# Patient Record
Sex: Male | Born: 1960 | Race: Black or African American | Hispanic: No | Marital: Single | Smoking: Never smoker
Health system: Southern US, Community
[De-identification: ages and names within clinical notes are randomized; demographics above are authoritative.]

---

## 2002-03-15 ENCOUNTER — Emergency Department (HOSPITAL_COMMUNITY): Admission: EM | Admit: 2002-03-15 | Discharge: 2002-03-15 | Payer: Self-pay

## 2004-11-12 ENCOUNTER — Emergency Department (HOSPITAL_COMMUNITY): Admission: EM | Admit: 2004-11-12 | Discharge: 2004-11-12 | Payer: Self-pay | Admitting: Emergency Medicine

## 2005-11-17 ENCOUNTER — Emergency Department (HOSPITAL_COMMUNITY): Admission: EM | Admit: 2005-11-17 | Discharge: 2005-11-17 | Payer: Self-pay | Admitting: Emergency Medicine

## 2006-11-07 ENCOUNTER — Emergency Department (HOSPITAL_COMMUNITY): Admission: EM | Admit: 2006-11-07 | Discharge: 2006-11-08 | Payer: Self-pay | Admitting: Emergency Medicine

## 2006-11-15 ENCOUNTER — Emergency Department (HOSPITAL_COMMUNITY): Admission: EM | Admit: 2006-11-15 | Discharge: 2006-11-15 | Payer: Self-pay | Admitting: Emergency Medicine

## 2007-06-21 ENCOUNTER — Emergency Department (HOSPITAL_COMMUNITY): Admission: EM | Admit: 2007-06-21 | Discharge: 2007-06-22 | Payer: Self-pay | Admitting: Emergency Medicine

## 2008-08-01 ENCOUNTER — Emergency Department (HOSPITAL_COMMUNITY): Admission: EM | Admit: 2008-08-01 | Discharge: 2008-08-01 | Payer: Self-pay | Admitting: Emergency Medicine

## 2009-08-29 ENCOUNTER — Emergency Department (HOSPITAL_COMMUNITY): Admission: EM | Admit: 2009-08-29 | Discharge: 2009-08-29 | Payer: Self-pay | Admitting: Emergency Medicine

## 2010-10-07 LAB — DIFFERENTIAL
Basophils Absolute: 0 10*3/uL (ref 0.0–0.1)
Basophils Relative: 1 % (ref 0–1)
Eosinophils Absolute: 0.1 10*3/uL (ref 0.0–0.7)
Eosinophils Relative: 1 % (ref 0–5)
Lymphs Abs: 2.1 10*3/uL (ref 0.7–4.0)
Monocytes Absolute: 0.5 10*3/uL (ref 0.1–1.0)
Monocytes Relative: 8 % (ref 3–12)
Neutro Abs: 3.3 10*3/uL (ref 1.7–7.7)
Neutrophils Relative %: 55 % (ref 43–77)

## 2010-10-07 LAB — POCT I-STAT, CHEM 8
Chloride: 105 mEq/L (ref 96–112)
TCO2: 28 mmol/L (ref 0–100)

## 2010-10-07 LAB — CBC
Hemoglobin: 12.7 g/dL — ABNORMAL LOW (ref 13.0–17.0)
MCV: 98.6 fL (ref 78.0–100.0)
Platelets: 253 10*3/uL (ref 150–400)
RDW: 12.7 % (ref 11.5–15.5)

## 2011-04-26 LAB — CULTURE, ROUTINE-ABSCESS

## 2019-06-28 ENCOUNTER — Encounter (HOSPITAL_COMMUNITY): Payer: Self-pay | Admitting: Emergency Medicine

## 2019-06-28 ENCOUNTER — Other Ambulatory Visit: Payer: Self-pay

## 2019-06-28 ENCOUNTER — Ambulatory Visit (HOSPITAL_COMMUNITY)
Admission: EM | Admit: 2019-06-28 | Discharge: 2019-06-28 | Disposition: A | Discharge status: Home / Self Care | Payer: Self-pay | Attending: Family Medicine | Admitting: Family Medicine

## 2019-06-28 DIAGNOSIS — S39012A Strain of muscle, fascia and tendon of lower back, initial encounter: Secondary | ICD-10-CM

## 2019-06-28 MED ORDER — IBUPROFEN 800 MG PO TABS: 800.0000 mg | ORAL_TABLET | Freq: Three times a day (TID) | ORAL | 0 refills | Status: DC

## 2019-06-28 MED ORDER — CYCLOBENZAPRINE HCL 10 MG PO TABS: ORAL_TABLET | ORAL | 0 refills | Status: DC
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
## 2019-06-28 NOTE — Discharge Instructions (Signed)

## 2020-05-13 ENCOUNTER — Ambulatory Visit: Payer: Self-pay | Attending: Critical Care Medicine

## 2020-05-13 ENCOUNTER — Ambulatory Visit: Payer: Self-pay | Admitting: *Deleted

## 2020-05-13 DIAGNOSIS — Z23 Encounter for immunization: Secondary | ICD-10-CM

## 2020-05-13 NOTE — Progress Notes (Signed)
Patient reports no concern with previous injection. Patient observed with no concern.

## 2021-07-21 ENCOUNTER — Ambulatory Visit (INDEPENDENT_AMBULATORY_CARE_PROVIDER_SITE_OTHER): Payer: Self-pay

## 2021-07-21 ENCOUNTER — Encounter (HOSPITAL_COMMUNITY): Payer: Self-pay | Admitting: Emergency Medicine

## 2021-07-21 ENCOUNTER — Ambulatory Visit (HOSPITAL_COMMUNITY)
Admission: EM | Admit: 2021-07-21 | Discharge: 2021-07-21 | Disposition: A | Discharge status: Home / Self Care | Payer: Self-pay | Attending: Urgent Care | Admitting: Urgent Care

## 2021-07-21 ENCOUNTER — Other Ambulatory Visit: Payer: Self-pay

## 2021-07-21 DIAGNOSIS — R319 Hematuria, unspecified: Secondary | ICD-10-CM

## 2021-07-21 DIAGNOSIS — R109 Unspecified abdominal pain: Secondary | ICD-10-CM

## 2021-07-21 LAB — POCT URINALYSIS DIPSTICK, ED / UC
Bilirubin Urine: NEGATIVE
Glucose, UA: NEGATIVE mg/dL
Ketones, ur: NEGATIVE mg/dL
Leukocytes,Ua: NEGATIVE
Nitrite: NEGATIVE
Protein, ur: NEGATIVE mg/dL
Specific Gravity, Urine: 1.025 (ref 1.005–1.030)
Urobilinogen, UA: 1 mg/dL (ref 0.0–1.0)
pH: 6 (ref 5.0–8.0)

## 2021-07-21 MED ORDER — TAMSULOSIN HCL 0.4 MG PO CAPS: 0.4000 mg | ORAL_CAPSULE | Freq: Every day | ORAL | 0 refills | Status: AC

## 2021-07-21 NOTE — Discharge Instructions (Signed)
Your flank pain is likely consistent with a kidney stone. It could also represent renal colic Sodas are a huge risk factor for both. MUST cut back on soda, start drinking water. 4-6 16oz bottles daily preferred. Will do a trial of tamsulosin - this will dilate the ureter to possibly pass a small stone. If your symptoms continue, you may need to establish care with a PCP to obtain further imaging such as an ultrasound or CT scan.
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
## 2021-07-21 NOTE — ED Triage Notes (Signed)
Pt having right flank pain for 3 days. Pain is constant. Denies falls or injuries or urinary or bowel problems.

## 2021-09-05 ENCOUNTER — Emergency Department (HOSPITAL_COMMUNITY)
Admission: EM | Admit: 2021-09-05 | Discharge: 2021-09-05 | Disposition: A | Discharge status: Home / Self Care | Payer: Self-pay | Attending: Emergency Medicine | Admitting: Emergency Medicine

## 2021-09-05 ENCOUNTER — Emergency Department (HOSPITAL_COMMUNITY): Payer: Self-pay

## 2021-09-05 DIAGNOSIS — T1490XA Injury, unspecified, initial encounter: Secondary | ICD-10-CM

## 2021-09-05 DIAGNOSIS — W3400XA Accidental discharge from unspecified firearms or gun, initial encounter: Secondary | ICD-10-CM | POA: Insufficient documentation

## 2021-09-05 DIAGNOSIS — S31101A Unspecified open wound of abdominal wall, left upper quadrant without penetration into peritoneal cavity, initial encounter: Secondary | ICD-10-CM | POA: Insufficient documentation

## 2021-09-05 DIAGNOSIS — Z23 Encounter for immunization: Secondary | ICD-10-CM | POA: Insufficient documentation

## 2021-09-05 DIAGNOSIS — S31139A Puncture wound of abdominal wall without foreign body, unspecified quadrant without penetration into peritoneal cavity, initial encounter: Secondary | ICD-10-CM

## 2021-09-05 LAB — COMPREHENSIVE METABOLIC PANEL
ALT: 16 U/L (ref 0–44)
AST: 23 U/L (ref 15–41)
Albumin: 4.3 g/dL (ref 3.5–5.0)
Alkaline Phosphatase: 85 U/L (ref 38–126)
Anion gap: 14 (ref 5–15)
BUN: 12 mg/dL (ref 6–20)
CO2: 21 mmol/L — ABNORMAL LOW (ref 22–32)
Calcium: 9.1 mg/dL (ref 8.9–10.3)
Chloride: 102 mmol/L (ref 98–111)
Creatinine, Ser: 1.26 mg/dL — ABNORMAL HIGH (ref 0.61–1.24)
GFR, Estimated: >60 mL/min (ref >60)
Glucose, Bld: 116 mg/dL — ABNORMAL HIGH (ref 70–99)
Potassium: 3.7 mmol/L (ref 3.5–5.1)
Sodium: 137 mmol/L (ref 135–145)
Total Bilirubin: 1 mg/dL (ref 0.3–1.2)
Total Protein: 7.7 g/dL (ref 6.5–8.1)

## 2021-09-05 LAB — ETHANOL: Alcohol, Ethyl (B): <10 mg/dL (ref <10)

## 2021-09-05 LAB — I-STAT CHEM 8, ED
BUN: 14 mg/dL (ref 6–20)
Calcium, Ion: 1.1 mmol/L — ABNORMAL LOW (ref 1.15–1.40)
Chloride: 102 mmol/L (ref 98–111)
Creatinine, Ser: 1.1 mg/dL (ref 0.61–1.24)
Glucose, Bld: 115 mg/dL — ABNORMAL HIGH (ref 70–99)
HCT: 48 % (ref 39.0–52.0)
Hemoglobin: 16.3 g/dL (ref 13.0–17.0)
Potassium: 3.6 mmol/L (ref 3.5–5.1)
Sodium: 140 mmol/L (ref 135–145)
TCO2: 24 mmol/L (ref 22–32)

## 2021-09-05 LAB — CBC
HCT: 45.9 % (ref 39.0–52.0)
Hemoglobin: 14.7 g/dL (ref 13.0–17.0)
MCH: 31.1 pg (ref 26.0–34.0)
MCHC: 32 g/dL (ref 30.0–36.0)
MCV: 97 fL (ref 80.0–100.0)
Platelets: 294 10*3/uL (ref 150–400)
RBC: 4.73 MIL/uL (ref 4.22–5.81)
RDW: 12.1 % (ref 11.5–15.5)
WBC: 7.1 10*3/uL (ref 4.0–10.5)
nRBC: 0 % (ref 0.0–0.2)

## 2021-09-05 LAB — LACTIC ACID, PLASMA: Lactic Acid, Venous: 1.3 mmol/L (ref 0.5–1.9)

## 2021-09-05 LAB — PROTIME-INR
INR: 1 (ref 0.8–1.2)
Prothrombin Time: 13.6 seconds (ref 11.4–15.2)

## 2021-09-05 LAB — SAMPLE TO BLOOD BANK

## 2021-09-05 MED ORDER — TETANUS-DIPHTH-ACELL PERTUSSIS 5-2.5-18.5 LF-MCG/0.5 IM SUSY
0.5000 mL | PREFILLED_SYRINGE | Freq: Once | INTRAMUSCULAR | Status: AC
  Administered 2021-09-05: 0.5 mL via INTRAMUSCULAR
  Filled 2021-09-05: qty 0.5

## 2021-09-05 MED ORDER — FENTANYL CITRATE PF 50 MCG/ML IJ SOSY
PREFILLED_SYRINGE | INTRAMUSCULAR | Status: AC
  Filled 2021-09-05: qty 1

## 2021-09-05 MED ORDER — CEFAZOLIN SODIUM-DEXTROSE 2-4 GM/100ML-% IV SOLN
2.0000 g | Freq: Once | INTRAVENOUS | Status: AC
  Administered 2021-09-05: 2 g via INTRAVENOUS
  Filled 2021-09-05: qty 100

## 2021-09-05 MED ORDER — FENTANYL CITRATE PF 50 MCG/ML IJ SOSY
50.0000 ug | PREFILLED_SYRINGE | Freq: Once | INTRAMUSCULAR | Status: AC
  Administered 2021-09-05: 50 ug via INTRAVENOUS

## 2021-09-05 MED ORDER — IOHEXOL 350 MG/ML SOLN
100.0000 mL | Freq: Once | INTRAVENOUS | Status: AC | PRN
  Administered 2021-09-05: 100 mL via INTRAVENOUS
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
## 2021-09-05 NOTE — ED Notes (Signed)
Out with family, steady gait
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
## 2021-09-05 NOTE — ED Notes (Signed)
Pt alert, NAD, calm, interactive, resps e/u. Steady on feet standing at [facility]. Son arrives to [facility].

## 2021-09-05 NOTE — ED Notes (Signed)
All patient belongings placed in two brown bags with patient labels on it.
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
## 2021-09-05 NOTE — ED Notes (Signed)
Report received from Anell Barr, RN. Pt received pending d/c. Pending [facility] arrival. PD present.
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
## 2021-09-18 ENCOUNTER — Other Ambulatory Visit: Payer: Self-pay

## 2021-09-18 ENCOUNTER — Encounter (HOSPITAL_COMMUNITY): Payer: Self-pay | Admitting: Emergency Medicine

## 2021-09-18 ENCOUNTER — Emergency Department (HOSPITAL_COMMUNITY)
Admission: EM | Admit: 2021-09-18 | Discharge: 2021-09-19 | Disposition: A | Discharge status: Home / Self Care | Payer: Self-pay | Attending: Student | Admitting: Student

## 2021-09-18 DIAGNOSIS — L929 Granulomatous disorder of the skin and subcutaneous tissue, unspecified: Secondary | ICD-10-CM | POA: Insufficient documentation

## 2021-09-18 DIAGNOSIS — Z5189 Encounter for other specified aftercare: Secondary | ICD-10-CM

## 2021-09-18 DIAGNOSIS — S31132D Puncture wound of abdominal wall without foreign body, epigastric region without penetration into peritoneal cavity, subsequent encounter: Secondary | ICD-10-CM | POA: Insufficient documentation

## 2021-09-18 DIAGNOSIS — Z4800 Encounter for change or removal of nonsurgical wound dressing: Secondary | ICD-10-CM | POA: Insufficient documentation

## 2021-09-18 DIAGNOSIS — W3400XD Accidental discharge from unspecified firearms or gun, subsequent encounter: Secondary | ICD-10-CM | POA: Insufficient documentation

## 2021-09-18 LAB — CBC WITH DIFFERENTIAL/PLATELET
Abs Immature Granulocytes: 0.01 10*3/uL (ref 0.00–0.07)
Basophils Absolute: 0.1 10*3/uL (ref 0.0–0.1)
Basophils Relative: 1 %
Eosinophils Absolute: 0.2 10*3/uL (ref 0.0–0.5)
Eosinophils Relative: 3 %
HCT: 41.6 % (ref 39.0–52.0)
Hemoglobin: 13.5 g/dL (ref 13.0–17.0)
Immature Granulocytes: 0 %
Lymphocytes Relative: 35 %
Lymphs Abs: 2.1 10*3/uL (ref 0.7–4.0)
MCH: 31.3 pg (ref 26.0–34.0)
MCHC: 32.5 g/dL (ref 30.0–36.0)
MCV: 96.5 fL (ref 80.0–100.0)
Monocytes Absolute: 0.6 10*3/uL (ref 0.1–1.0)
Monocytes Relative: 10 %
Neutro Abs: 3.2 10*3/uL (ref 1.7–7.7)
Neutrophils Relative %: 51 %
Platelets: 325 10*3/uL (ref 150–400)
RBC: 4.31 MIL/uL (ref 4.22–5.81)
RDW: 12.1 % (ref 11.5–15.5)
WBC: 6.1 10*3/uL (ref 4.0–10.5)
nRBC: 0 % (ref 0.0–0.2)

## 2021-09-18 LAB — URINALYSIS, ROUTINE W REFLEX MICROSCOPIC
Bacteria, UA: NONE SEEN
Bilirubin Urine: NEGATIVE
Glucose, UA: NEGATIVE mg/dL
Ketones, ur: NEGATIVE mg/dL
Leukocytes,Ua: NEGATIVE
Nitrite: NEGATIVE
Protein, ur: NEGATIVE mg/dL
Specific Gravity, Urine: 1.02 (ref 1.005–1.030)
pH: 5 (ref 5.0–8.0)

## 2021-09-18 LAB — COMPREHENSIVE METABOLIC PANEL
ALT: 15 U/L (ref 0–44)
AST: 17 U/L (ref 15–41)
Albumin: 3.7 g/dL (ref 3.5–5.0)
Alkaline Phosphatase: 83 U/L (ref 38–126)
Anion gap: 7 (ref 5–15)
BUN: 14 mg/dL (ref 6–20)
CO2: 26 mmol/L (ref 22–32)
Calcium: 9.2 mg/dL (ref 8.9–10.3)
Chloride: 105 mmol/L (ref 98–111)
Creatinine, Ser: 1.11 mg/dL (ref 0.61–1.24)
GFR, Estimated: >60 mL/min (ref >60)
Glucose, Bld: 98 mg/dL (ref 70–99)
Potassium: 4 mmol/L (ref 3.5–5.1)
Sodium: 138 mmol/L (ref 135–145)
Total Bilirubin: 0.2 mg/dL — ABNORMAL LOW (ref 0.3–1.2)
Total Protein: 7.5 g/dL (ref 6.5–8.1)

## 2021-09-18 LAB — LACTIC ACID, PLASMA: Lactic Acid, Venous: 0.8 mmol/L (ref 0.5–1.9)
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
## 2021-09-23 LAB — CULTURE, BLOOD (ROUTINE X 2)
Culture: NO GROWTH
Culture: NO GROWTH
Special Requests: ADEQUATE
Special Requests: ADEQUATE

## 2022-03-07 ENCOUNTER — Ambulatory Visit (HOSPITAL_COMMUNITY)
Admission: RE | Admit: 2022-03-07 | Discharge: 2022-03-07 | Disposition: A | Discharge status: Home / Self Care | Payer: Self-pay | Source: Ambulatory Visit | Attending: Internal Medicine | Admitting: Internal Medicine

## 2022-03-07 ENCOUNTER — Encounter (HOSPITAL_COMMUNITY): Payer: Self-pay

## 2022-03-07 DIAGNOSIS — M25562 Pain in left knee: Secondary | ICD-10-CM

## 2022-03-07 DIAGNOSIS — R0789 Other chest pain: Secondary | ICD-10-CM

## 2022-03-07 MED ORDER — IBUPROFEN 800 MG PO TABS
ORAL_TABLET | ORAL | Status: AC
  Filled 2022-03-07: qty 1

## 2022-03-07 MED ORDER — IBUPROFEN 800 MG PO TABS
800.0000 mg | ORAL_TABLET | Freq: Once | ORAL | Status: AC
  Administered 2022-03-07: 800 mg via ORAL
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
## 2023-06-28 ENCOUNTER — Emergency Department (HOSPITAL_COMMUNITY): Payer: Medicaid Other

## 2023-06-28 ENCOUNTER — Inpatient Hospital Stay (HOSPITAL_COMMUNITY)
Admission: EM | Admit: 2023-06-28 | Discharge: 2023-07-04 | DRG: 481 | Disposition: A | Discharge status: Home Health | Payer: Medicaid Other | Attending: Student | Admitting: Student

## 2023-06-28 ENCOUNTER — Other Ambulatory Visit: Payer: Self-pay

## 2023-06-28 DIAGNOSIS — Z791 Long term (current) use of non-steroidal anti-inflammatories (NSAID): Secondary | ICD-10-CM

## 2023-06-28 DIAGNOSIS — S72452B Displaced supracondylar fracture without intracondylar extension of lower end of left femur, initial encounter for open fracture type I or II: Secondary | ICD-10-CM | POA: Diagnosis present

## 2023-06-28 DIAGNOSIS — S82042B Displaced comminuted fracture of left patella, initial encounter for open fracture type I or II: Secondary | ICD-10-CM | POA: Diagnosis present

## 2023-06-28 DIAGNOSIS — S72462B Displaced supracondylar fracture with intracondylar extension of lower end of left femur, initial encounter for open fracture type I or II: Secondary | ICD-10-CM | POA: Diagnosis present

## 2023-06-28 DIAGNOSIS — S72432B Displaced fracture of medial condyle of left femur, initial encounter for open fracture type I or II: Principal | ICD-10-CM | POA: Diagnosis present

## 2023-06-28 DIAGNOSIS — S72402B Unspecified fracture of lower end of left femur, initial encounter for open fracture type I or II: Secondary | ICD-10-CM | POA: Diagnosis present

## 2023-06-28 DIAGNOSIS — W3400XA Accidental discharge from unspecified firearms or gun, initial encounter: Secondary | ICD-10-CM | POA: Diagnosis not present

## 2023-06-28 DIAGNOSIS — E559 Vitamin D deficiency, unspecified: Secondary | ICD-10-CM | POA: Diagnosis present

## 2023-06-28 DIAGNOSIS — D62 Acute posthemorrhagic anemia: Secondary | ICD-10-CM | POA: Diagnosis not present

## 2023-06-28 DIAGNOSIS — S72352B Displaced comminuted fracture of shaft of left femur, initial encounter for open fracture type I or II: Secondary | ICD-10-CM | POA: Diagnosis present

## 2023-06-28 DIAGNOSIS — Z23 Encounter for immunization: Secondary | ICD-10-CM

## 2023-06-28 DIAGNOSIS — S728X2B Other fracture of left femur, initial encounter for open fracture type I or II: Secondary | ICD-10-CM

## 2023-06-28 LAB — I-STAT CHEM 8, ED
BUN: 22 mg/dL (ref 8–23)
Calcium, Ion: 1.08 mmol/L — ABNORMAL LOW (ref 1.15–1.40)
Chloride: 107 mmol/L (ref 98–111)
Creatinine, Ser: 1.3 mg/dL — ABNORMAL HIGH (ref 0.61–1.24)
Glucose, Bld: 135 mg/dL — ABNORMAL HIGH (ref 70–99)
HCT: 40 % (ref 39.0–52.0)
Hemoglobin: 13.6 g/dL (ref 13.0–17.0)
Potassium: 3.7 mmol/L (ref 3.5–5.1)
Sodium: 142 mmol/L (ref 135–145)
TCO2: 22 mmol/L (ref 22–32)

## 2023-06-28 LAB — URINALYSIS, ROUTINE W REFLEX MICROSCOPIC
Bilirubin Urine: NEGATIVE
Glucose, UA: NEGATIVE mg/dL
Ketones, ur: NEGATIVE mg/dL
Leukocytes,Ua: NEGATIVE
Nitrite: NEGATIVE
Protein, ur: NEGATIVE mg/dL
Specific Gravity, Urine: 1.039 — ABNORMAL HIGH (ref 1.005–1.030)
pH: 7 (ref 5.0–8.0)

## 2023-06-28 LAB — COMPREHENSIVE METABOLIC PANEL
ALT: 17 U/L (ref 0–44)
AST: 18 U/L (ref 15–41)
Albumin: 3.5 g/dL (ref 3.5–5.0)
Alkaline Phosphatase: 83 U/L (ref 38–126)
Anion gap: 10 (ref 5–15)
BUN: 20 mg/dL (ref 8–23)
CO2: 21 mmol/L — ABNORMAL LOW (ref 22–32)
Calcium: 8.5 mg/dL — ABNORMAL LOW (ref 8.9–10.3)
Chloride: 109 mmol/L (ref 98–111)
Creatinine, Ser: 1.21 mg/dL (ref 0.61–1.24)
GFR, Estimated: >60 mL/min (ref >60)
Glucose, Bld: 138 mg/dL — ABNORMAL HIGH (ref 70–99)
Potassium: 3.7 mmol/L (ref 3.5–5.1)
Sodium: 140 mmol/L (ref 135–145)
Total Bilirubin: 0.8 mg/dL (ref <1.2)
Total Protein: 6.6 g/dL (ref 6.5–8.1)

## 2023-06-28 LAB — CBC
HCT: 40.2 % (ref 39.0–52.0)
Hemoglobin: 13.1 g/dL (ref 13.0–17.0)
MCH: 30.3 pg (ref 26.0–34.0)
MCHC: 32.6 g/dL (ref 30.0–36.0)
MCV: 93.1 fL (ref 80.0–100.0)
Platelets: 303 10*3/uL (ref 150–400)
RBC: 4.32 MIL/uL (ref 4.22–5.81)
RDW: 12.1 % (ref 11.5–15.5)
WBC: 7.4 10*3/uL (ref 4.0–10.5)
nRBC: 0 % (ref 0.0–0.2)

## 2023-06-28 LAB — SAMPLE TO BLOOD BANK

## 2023-06-28 LAB — PROTIME-INR
INR: 1.1 (ref 0.8–1.2)
Prothrombin Time: 13.9 s (ref 11.4–15.2)

## 2023-06-28 LAB — ETHANOL: Alcohol, Ethyl (B): <10 mg/dL (ref <10)

## 2023-06-28 LAB — I-STAT CG4 LACTIC ACID, ED: Lactic Acid, Venous: 3.5 mmol/L (ref 0.5–1.9)

## 2023-06-28 MED ORDER — FENTANYL CITRATE PF 50 MCG/ML IJ SOSY: 50.0000 ug | PREFILLED_SYRINGE | Freq: Once | INTRAMUSCULAR | Status: DC

## 2023-06-28 MED ORDER — TETANUS-DIPHTH-ACELL PERTUSSIS 5-2.5-18.5 LF-MCG/0.5 IM SUSY
0.5000 mL | PREFILLED_SYRINGE | Freq: Once | INTRAMUSCULAR | Status: AC
  Administered 2023-06-28: 0.5 mL via INTRAMUSCULAR
  Filled 2023-06-28: qty 0.5

## 2023-06-28 MED ORDER — CEFAZOLIN SODIUM-DEXTROSE 1-4 GM/50ML-% IV SOLN: 1.0000 g | Freq: Once | INTRAVENOUS | Status: DC

## 2023-06-28 MED ORDER — SENNOSIDES-DOCUSATE SODIUM 8.6-50 MG PO TABS
1.0000 | ORAL_TABLET | Freq: Every evening | ORAL | Status: DC | PRN
  Administered 2023-07-02: 1 via ORAL
  Filled 2023-06-28: qty 1

## 2023-06-28 MED ORDER — KETOROLAC TROMETHAMINE 30 MG/ML IJ SOLN
30.0000 mg | Freq: Three times a day (TID) | INTRAMUSCULAR | Status: DC
  Administered 2023-06-28 – 2023-06-29 (×2): 30 mg via INTRAVENOUS
  Filled 2023-06-28 (×3): qty 1

## 2023-06-28 MED ORDER — SODIUM CHLORIDE 0.9 % IV SOLN: INTRAVENOUS | Status: AC

## 2023-06-28 MED ORDER — OXYCODONE HCL 5 MG PO TABS
5.0000 mg | ORAL_TABLET | ORAL | Status: DC | PRN
  Administered 2023-06-29: 5 mg via ORAL
  Filled 2023-06-28: qty 1

## 2023-06-28 MED ORDER — HYDROMORPHONE HCL 1 MG/ML IJ SOLN
1.0000 mg | Freq: Once | INTRAMUSCULAR | Status: AC
Start: 2023-06-28 — End: 2023-06-28
  Administered 2023-06-28: 1 mg via INTRAVENOUS
  Filled 2023-06-28: qty 1

## 2023-06-28 MED ORDER — CEFAZOLIN SODIUM-DEXTROSE 2-4 GM/100ML-% IV SOLN
2.0000 g | Freq: Three times a day (TID) | INTRAVENOUS | Status: DC
  Administered 2023-06-29 (×2): 2 g via INTRAVENOUS
  Filled 2023-06-28 (×2): qty 100

## 2023-06-28 MED ORDER — IOHEXOL 350 MG/ML SOLN
100.0000 mL | Freq: Once | INTRAVENOUS | Status: AC | PRN
  Administered 2023-06-28: 100 mL via INTRAVENOUS

## 2023-06-28 MED ORDER — FENTANYL CITRATE PF 50 MCG/ML IJ SOSY
100.0000 ug | PREFILLED_SYRINGE | Freq: Once | INTRAMUSCULAR | Status: AC
Start: 2023-06-28 — End: 2023-06-28
  Administered 2023-06-28: 100 ug via INTRAVENOUS
  Filled 2023-06-28: qty 2

## 2023-06-28 MED ORDER — METHOCARBAMOL 500 MG PO TABS
500.0000 mg | ORAL_TABLET | Freq: Four times a day (QID) | ORAL | Status: DC | PRN
  Administered 2023-06-29 – 2023-07-01 (×4): 500 mg via ORAL
  Filled 2023-06-28 (×4): qty 1

## 2023-06-28 MED ORDER — DOCUSATE SODIUM 100 MG PO CAPS: 100.0000 mg | ORAL_CAPSULE | Freq: Two times a day (BID) | ORAL | Status: DC

## 2023-06-28 MED ORDER — HYDROMORPHONE HCL 1 MG/ML IJ SOLN
0.5000 mg | INTRAMUSCULAR | Status: DC | PRN
  Administered 2023-06-28: 0.5 mg via INTRAVENOUS
  Filled 2023-06-28: qty 1

## 2023-06-28 MED ORDER — FENTANYL CITRATE (PF) 100 MCG/2ML IJ SOLN
INTRAMUSCULAR | Status: AC
  Filled 2023-06-28: qty 2

## 2023-06-28 MED ORDER — FENTANYL CITRATE (PF) 100 MCG/2ML IJ SOLN
INTRAMUSCULAR | Status: AC | PRN
  Administered 2023-06-28: 100 ug via INTRAVENOUS

## 2023-06-28 MED ORDER — FENTANYL CITRATE PF 50 MCG/ML IJ SOSY
PREFILLED_SYRINGE | INTRAMUSCULAR | Status: AC
  Filled 2023-06-28: qty 2

## 2023-06-28 MED ORDER — CEFAZOLIN SODIUM-DEXTROSE 2-4 GM/100ML-% IV SOLN
2.0000 g | Freq: Once | INTRAVENOUS | Status: AC
  Administered 2023-06-28: 2 g via INTRAVENOUS

## 2023-06-28 MED ORDER — FENTANYL CITRATE PF 50 MCG/ML IJ SOSY
50.0000 ug | PREFILLED_SYRINGE | Freq: Once | INTRAMUSCULAR | Status: AC
Start: 2023-06-28 — End: 2023-06-28
  Administered 2023-06-28: 50 ug via INTRAVENOUS

## 2023-06-28 MED ORDER — BISACODYL 5 MG PO TBEC: 5.0000 mg | DELAYED_RELEASE_TABLET | Freq: Every day | ORAL | Status: DC | PRN

## 2023-06-28 MED ORDER — METHOCARBAMOL 1000 MG/10ML IJ SOLN
500.0000 mg | Freq: Four times a day (QID) | INTRAMUSCULAR | Status: DC | PRN
  Administered 2023-06-29: 500 mg via INTRAVENOUS
  Filled 2023-06-28: qty 10
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
## 2023-06-28 NOTE — TOC CAGE-AID Note (Signed)
Transition of Care [facility]) - CAGE-AID Screening   Patient Details  Name: Sosa Pacifico MRN: [id number] Date of Birth: 01-29-1961   Judie Bonus, RN Phone Number: 06/28/2023, 8:32 PM   Clinical Narrative:  Pt reports he does not currently use etoh/tobacco or elicit substances  CAGE-AID Screening:    Have You Ever Felt You Ought to Cut Down on Your Drinking or Drug Use?: No Have People Annoyed You By Critizing Your Drinking Or Drug Use?: No Have You Felt Bad Or Guilty About Your Drinking Or Drug Use?: No Have You Ever Had a Drink or Used Drugs First Thing In The Morning to Steady Your Nerves or to Get Rid of a Hangover?: No CAGE-AID Score: 0
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
## 2023-06-28 NOTE — ED Notes (Signed)
Belongings placed in paper bags and sent with [employer]. Belongings left at bedside; set of keys, cell phone and small paper with phone numbers on it.

## 2023-06-28 NOTE — ED Notes (Signed)
Daughter Wynema Birch [phone] would like an update asap

## 2023-06-28 NOTE — ED Notes (Signed)
Pt on phone updating daughter and family at this time.

## 2023-06-29 ENCOUNTER — Other Ambulatory Visit: Payer: Self-pay

## 2023-06-29 ENCOUNTER — Inpatient Hospital Stay (HOSPITAL_COMMUNITY): Payer: Medicaid Other

## 2023-06-29 ENCOUNTER — Inpatient Hospital Stay (HOSPITAL_COMMUNITY): Payer: Medicaid Other | Admitting: Anesthesiology

## 2023-06-29 ENCOUNTER — Encounter (HOSPITAL_COMMUNITY)
Admission: EM | Disposition: A | Discharge status: Home Health | Payer: Self-pay | Source: Home / Self Care | Attending: Student

## 2023-06-29 ENCOUNTER — Encounter (HOSPITAL_COMMUNITY): Payer: Self-pay | Admitting: Orthopedic Surgery

## 2023-06-29 DIAGNOSIS — S72402A Unspecified fracture of lower end of left femur, initial encounter for closed fracture: Secondary | ICD-10-CM

## 2023-06-29 HISTORY — PX: ORIF FEMUR FRACTURE: SHX2119

## 2023-06-29 LAB — CBC
HCT: 36.6 % — ABNORMAL LOW (ref 39.0–52.0)
HCT: 36.7 % — ABNORMAL LOW (ref 39.0–52.0)
Hemoglobin: 12 g/dL — ABNORMAL LOW (ref 13.0–17.0)
Hemoglobin: 11.9 g/dL — ABNORMAL LOW (ref 13.0–17.0)
MCH: 30.5 pg (ref 26.0–34.0)
MCH: 30.6 pg (ref 26.0–34.0)
MCHC: 32.8 g/dL (ref 30.0–36.0)
MCHC: 32.4 g/dL (ref 30.0–36.0)
MCV: 93.1 fL (ref 80.0–100.0)
MCV: 94.3 fL (ref 80.0–100.0)
Platelets: 247 10*3/uL (ref 150–400)
Platelets: 225 10*3/uL (ref 150–400)
RBC: 3.93 MIL/uL — ABNORMAL LOW (ref 4.22–5.81)
RBC: 3.89 MIL/uL — ABNORMAL LOW (ref 4.22–5.81)
RDW: 12.2 % (ref 11.5–15.5)
RDW: 12.4 % (ref 11.5–15.5)
WBC: 9.1 10*3/uL (ref 4.0–10.5)
WBC: 8.9 10*3/uL (ref 4.0–10.5)
nRBC: 0 % (ref 0.0–0.2)
nRBC: 0 % (ref 0.0–0.2)

## 2023-06-29 LAB — SURGICAL PCR SCREEN
MRSA, PCR: NEGATIVE
Staphylococcus aureus: NEGATIVE

## 2023-06-29 LAB — CREATININE, SERUM
Creatinine, Ser: 1.08 mg/dL (ref 0.61–1.24)
GFR, Estimated: >60 mL/min (ref >60)

## 2023-06-29 LAB — VITAMIN D 25 HYDROXY (VIT D DEFICIENCY, FRACTURES): Vit D, 25-Hydroxy: 11.88 ng/mL — ABNORMAL LOW (ref 30–100)

## 2023-06-29 LAB — HIV ANTIBODY (ROUTINE TESTING W REFLEX): HIV Screen 4th Generation wRfx: NONREACTIVE

## 2023-06-29 LAB — LACTIC ACID, PLASMA: Lactic Acid, Venous: 0.9 mmol/L (ref 0.5–1.9)

## 2023-06-29 SURGERY — OPEN REDUCTION INTERNAL FIXATION (ORIF) DISTAL FEMUR FRACTURE
Anesthesia: General | Laterality: Left

## 2023-06-29 MED ORDER — CHLORHEXIDINE GLUCONATE 0.12 % MT SOLN
15.0000 mL | Freq: Once | OROMUCOSAL | Status: AC
  Filled 2023-06-29: qty 15

## 2023-06-29 MED ORDER — KETAMINE HCL 50 MG/5ML IJ SOSY
PREFILLED_SYRINGE | INTRAMUSCULAR | Status: AC
  Filled 2023-06-29: qty 5

## 2023-06-29 MED ORDER — VANCOMYCIN HCL 1000 MG IV SOLR
INTRAVENOUS | Status: DC | PRN
  Administered 2023-06-29: 1000 mg

## 2023-06-29 MED ORDER — ONDANSETRON HCL 4 MG/2ML IJ SOLN
INTRAMUSCULAR | Status: DC | PRN
  Administered 2023-06-29: 4 mg via INTRAVENOUS

## 2023-06-29 MED ORDER — MIDAZOLAM HCL 2 MG/2ML IJ SOLN
INTRAMUSCULAR | Status: AC
  Filled 2023-06-29: qty 2

## 2023-06-29 MED ORDER — MIDAZOLAM HCL 2 MG/2ML IJ SOLN
INTRAMUSCULAR | Status: DC | PRN
  Administered 2023-06-29: 2 mg via INTRAVENOUS

## 2023-06-29 MED ORDER — PHENYLEPHRINE 80 MCG/ML (10ML) SYRINGE FOR IV PUSH (FOR BLOOD PRESSURE SUPPORT)
PREFILLED_SYRINGE | INTRAVENOUS | Status: DC | PRN
  Administered 2023-06-29: 80 ug via INTRAVENOUS
  Administered 2023-06-29 (×2): 120 ug via INTRAVENOUS

## 2023-06-29 MED ORDER — ONDANSETRON HCL 4 MG/2ML IJ SOLN: 4.0000 mg | Freq: Four times a day (QID) | INTRAMUSCULAR | Status: DC | PRN

## 2023-06-29 MED ORDER — LIDOCAINE 2% (20 MG/ML) 5 ML SYRINGE
INTRAMUSCULAR | Status: DC | PRN
  Administered 2023-06-29: 100 mg via INTRAVENOUS

## 2023-06-29 MED ORDER — HYDROMORPHONE HCL 1 MG/ML IJ SOLN: 0.2500 mg | INTRAMUSCULAR | Status: DC | PRN

## 2023-06-29 MED ORDER — KETOROLAC TROMETHAMINE 15 MG/ML IJ SOLN
15.0000 mg | Freq: Four times a day (QID) | INTRAMUSCULAR | Status: AC
  Administered 2023-06-29 – 2023-07-03 (×16): 15 mg via INTRAVENOUS
  Filled 2023-06-29 (×13): qty 1

## 2023-06-29 MED ORDER — PROPOFOL 10 MG/ML IV BOLUS
INTRAVENOUS | Status: AC
  Filled 2023-06-29: qty 20

## 2023-06-29 MED ORDER — OXYCODONE HCL 5 MG/5ML PO SOLN: 5.0000 mg | Freq: Once | ORAL | Status: DC | PRN

## 2023-06-29 MED ORDER — ALBUMIN HUMAN 5 % IV SOLN: INTRAVENOUS | Status: DC | PRN

## 2023-06-29 MED ORDER — DEXMEDETOMIDINE HCL IN NACL 80 MCG/20ML IV SOLN
INTRAVENOUS | Status: AC
  Filled 2023-06-29: qty 20

## 2023-06-29 MED ORDER — ONDANSETRON HCL 4 MG/2ML IJ SOLN
INTRAMUSCULAR | Status: AC
  Filled 2023-06-29: qty 2

## 2023-06-29 MED ORDER — GUAIFENESIN ER 600 MG PO TB12
600.0000 mg | ORAL_TABLET | Freq: Two times a day (BID) | ORAL | Status: DC
  Administered 2023-06-29 – 2023-07-04 (×11): 600 mg via ORAL
  Filled 2023-06-29 (×11): qty 1

## 2023-06-29 MED ORDER — HYDROMORPHONE HCL 1 MG/ML IJ SOLN
INTRAMUSCULAR | Status: DC | PRN
  Administered 2023-06-29: .5 mg via INTRAVENOUS

## 2023-06-29 MED ORDER — CEFAZOLIN SODIUM 1 G IJ SOLR
INTRAMUSCULAR | Status: AC
  Filled 2023-06-29: qty 20

## 2023-06-29 MED ORDER — SODIUM CHLORIDE 0.9% FLUSH
10.0000 mL | Freq: Two times a day (BID) | INTRAVENOUS | Status: DC
  Administered 2023-06-29 – 2023-07-03 (×9): 10 mL via INTRAVENOUS

## 2023-06-29 MED ORDER — PHENYLEPHRINE 80 MCG/ML (10ML) SYRINGE FOR IV PUSH (FOR BLOOD PRESSURE SUPPORT)
PREFILLED_SYRINGE | INTRAVENOUS | Status: AC
  Filled 2023-06-29: qty 10

## 2023-06-29 MED ORDER — ORAL CARE MOUTH RINSE: 15.0000 mL | Freq: Once | OROMUCOSAL | Status: AC

## 2023-06-29 MED ORDER — SODIUM CHLORIDE 0.9 % IV SOLN: INTRAVENOUS | Status: DC

## 2023-06-29 MED ORDER — OXYCODONE HCL 5 MG PO TABS: 5.0000 mg | ORAL_TABLET | Freq: Once | ORAL | Status: DC | PRN

## 2023-06-29 MED ORDER — DEXMEDETOMIDINE HCL IN NACL 80 MCG/20ML IV SOLN
INTRAVENOUS | Status: DC | PRN
  Administered 2023-06-29 (×3): 4 ug via INTRAVENOUS

## 2023-06-29 MED ORDER — METOCLOPRAMIDE HCL 5 MG/ML IJ SOLN
5.0000 mg | Freq: Three times a day (TID) | INTRAMUSCULAR | Status: DC | PRN
Start: 2023-06-29 — End: 2023-07-04

## 2023-06-29 MED ORDER — CHLORHEXIDINE GLUCONATE 0.12 % MT SOLN
OROMUCOSAL | Status: AC
  Administered 2023-06-29: 15 mL via OROMUCOSAL
  Filled 2023-06-29: qty 15

## 2023-06-29 MED ORDER — SODIUM CHLORIDE 0.9 % IV SOLN: INTRAVENOUS | Status: DC | PRN

## 2023-06-29 MED ORDER — SUGAMMADEX SODIUM 200 MG/2ML IV SOLN
INTRAVENOUS | Status: DC | PRN
  Administered 2023-06-29: 200 mg via INTRAVENOUS

## 2023-06-29 MED ORDER — HYDROMORPHONE HCL 1 MG/ML IJ SOLN
INTRAMUSCULAR | Status: AC
  Filled 2023-06-29: qty 0.5

## 2023-06-29 MED ORDER — ROCURONIUM BROMIDE 10 MG/ML (PF) SYRINGE
PREFILLED_SYRINGE | INTRAVENOUS | Status: DC | PRN
  Administered 2023-06-29: 20 mg via INTRAVENOUS
  Administered 2023-06-29: 70 mg via INTRAVENOUS

## 2023-06-29 MED ORDER — DOCUSATE SODIUM 100 MG PO CAPS
100.0000 mg | ORAL_CAPSULE | Freq: Two times a day (BID) | ORAL | Status: DC
  Administered 2023-06-29 – 2023-07-04 (×11): 100 mg via ORAL
  Filled 2023-06-29 (×11): qty 1

## 2023-06-29 MED ORDER — METOCLOPRAMIDE HCL 5 MG PO TABS: 5.0000 mg | ORAL_TABLET | Freq: Three times a day (TID) | ORAL | Status: DC | PRN

## 2023-06-29 MED ORDER — PROPOFOL 10 MG/ML IV BOLUS
INTRAVENOUS | Status: DC | PRN
  Administered 2023-06-29: 200 mg via INTRAVENOUS

## 2023-06-29 MED ORDER — LIDOCAINE 2% (20 MG/ML) 5 ML SYRINGE
INTRAMUSCULAR | Status: AC
  Filled 2023-06-29: qty 5

## 2023-06-29 MED ORDER — KETAMINE HCL 10 MG/ML IJ SOLN
INTRAMUSCULAR | Status: DC | PRN
  Administered 2023-06-29 (×5): 10 mg via INTRAVENOUS

## 2023-06-29 MED ORDER — DIPHENHYDRAMINE HCL 12.5 MG/5ML PO ELIX: 12.5000 mg | ORAL_SOLUTION | ORAL | Status: DC | PRN

## 2023-06-29 MED ORDER — CEFAZOLIN SODIUM-DEXTROSE 2-4 GM/100ML-% IV SOLN
2.0000 g | Freq: Three times a day (TID) | INTRAVENOUS | Status: AC
  Administered 2023-06-29 – 2023-06-30 (×3): 2 g via INTRAVENOUS
  Filled 2023-06-29 (×3): qty 100

## 2023-06-29 MED ORDER — DEXAMETHASONE SODIUM PHOSPHATE 10 MG/ML IJ SOLN
INTRAMUSCULAR | Status: AC
  Filled 2023-06-29: qty 1

## 2023-06-29 MED ORDER — ROCURONIUM BROMIDE 10 MG/ML (PF) SYRINGE
PREFILLED_SYRINGE | INTRAVENOUS | Status: AC
  Filled 2023-06-29: qty 10

## 2023-06-29 MED ORDER — DEXAMETHASONE SODIUM PHOSPHATE 10 MG/ML IJ SOLN
INTRAMUSCULAR | Status: DC | PRN
  Administered 2023-06-29: 10 mg via INTRAVENOUS

## 2023-06-29 MED ORDER — ENOXAPARIN SODIUM 40 MG/0.4ML IJ SOSY
40.0000 mg | PREFILLED_SYRINGE | INTRAMUSCULAR | Status: DC
  Administered 2023-06-30 – 2023-07-04 (×5): 40 mg via SUBCUTANEOUS
  Filled 2023-06-29 (×5): qty 0.4

## 2023-06-29 MED ORDER — OXYCODONE HCL 5 MG PO TABS
5.0000 mg | ORAL_TABLET | ORAL | Status: DC | PRN
  Administered 2023-06-29 – 2023-07-01 (×4): 10 mg via ORAL
  Administered 2023-07-04: 5 mg via ORAL
  Administered 2023-07-04: 10 mg via ORAL
  Filled 2023-06-29 (×4): qty 2
  Filled 2023-06-29: qty 1
  Filled 2023-06-29 (×2): qty 2

## 2023-06-29 MED ORDER — HYDRALAZINE HCL 10 MG PO TABS: 10.0000 mg | ORAL_TABLET | Freq: Four times a day (QID) | ORAL | Status: DC | PRN

## 2023-06-29 MED ORDER — ONDANSETRON HCL 4 MG PO TABS: 4.0000 mg | ORAL_TABLET | Freq: Four times a day (QID) | ORAL | Status: DC | PRN

## 2023-06-29 MED ORDER — ONDANSETRON HCL 4 MG/2ML IJ SOLN: 4.0000 mg | Freq: Once | INTRAMUSCULAR | Status: DC | PRN

## 2023-06-29 MED ORDER — VANCOMYCIN HCL 1000 MG IV SOLR
INTRAVENOUS | Status: AC
  Filled 2023-06-29: qty 20

## 2023-06-29 MED ORDER — 0.9 % SODIUM CHLORIDE (POUR BTL) OPTIME
TOPICAL | Status: DC | PRN
  Administered 2023-06-29: 1000 mL

## 2023-06-29 MED ORDER — DROPERIDOL 2.5 MG/ML IJ SOLN: 0.6250 mg | Freq: Once | INTRAMUSCULAR | Status: DC | PRN

## 2023-06-29 MED ORDER — ACETAMINOPHEN 500 MG PO TABS
1000.0000 mg | ORAL_TABLET | Freq: Four times a day (QID) | ORAL | Status: AC
  Administered 2023-06-29 – 2023-06-30 (×4): 1000 mg via ORAL
  Filled 2023-06-29 (×3): qty 2

## 2023-06-29 MED ORDER — HYDROMORPHONE HCL 1 MG/ML IJ SOLN: 0.5000 mg | INTRAMUSCULAR | Status: DC | PRN

## 2023-06-29 SURGICAL SUPPLY — 66 items
BAG COUNTER SPONGE SURGICOUNT (BAG) ×2 IMPLANT
BIT DRILL 4.3 (BIT) ×1
BIT DRILL 4.3X300MM (BIT) IMPLANT
BIT DRILL LONG 3.3 (BIT) IMPLANT
BIT DRILL QC 3.3X195 (BIT) IMPLANT
BLADE CLIPPER SURG (BLADE) IMPLANT
BNDG COHESIVE 6X5 TAN ST LF (GAUZE/BANDAGES/DRESSINGS) ×2 IMPLANT
BNDG ELASTIC 4INX 5YD STR LF (GAUZE/BANDAGES/DRESSINGS) IMPLANT
BNDG ELASTIC 6INX 5YD STR LF (GAUZE/BANDAGES/DRESSINGS) IMPLANT
BNDG ELASTIC 6X10 VLCR STRL LF (GAUZE/BANDAGES/DRESSINGS) ×2 IMPLANT
BRUSH SCRUB EZ PLAIN DRY (MISCELLANEOUS) ×4 IMPLANT
CANISTER SUCT 3000ML PPV (MISCELLANEOUS) ×2 IMPLANT
CAP LOCK NCB (Cap) IMPLANT
CHLORAPREP W/TINT 26 (MISCELLANEOUS) ×2 IMPLANT
COVER SURGICAL LIGHT HANDLE (MISCELLANEOUS) ×2 IMPLANT
DERMABOND ADVANCED .7 DNX12 (GAUZE/BANDAGES/DRESSINGS) IMPLANT
DRAPE C-ARM 42X72 X-RAY (DRAPES) ×2 IMPLANT
DRAPE C-ARMOR (DRAPES) ×2 IMPLANT
DRAPE HALF SHEET 40X57 (DRAPES) ×4 IMPLANT
DRAPE SURG 17X23 STRL (DRAPES) ×2 IMPLANT
DRAPE SURG ORHT 6 SPLT 77X108 (DRAPES) ×4 IMPLANT
DRAPE U-SHAPE 47X51 STRL (DRAPES) ×2 IMPLANT
DRESSING MEPILEX FLEX 4X4 (GAUZE/BANDAGES/DRESSINGS) IMPLANT
DRSG ADAPTIC 3X8 NADH LF (GAUZE/BANDAGES/DRESSINGS) IMPLANT
DRSG MEPILEX FLEX 4X4 (GAUZE/BANDAGES/DRESSINGS) ×1 IMPLANT
DRSG MEPILEX POST OP 4X12 (GAUZE/BANDAGES/DRESSINGS) IMPLANT
DRSG MEPILEX POST OP 4X8 (GAUZE/BANDAGES/DRESSINGS) IMPLANT
ELECT REM PT RETURN 9FT ADLT (ELECTROSURGICAL) ×1 IMPLANT
ELECTRODE REM PT RTRN 9FT ADLT (ELECTROSURGICAL) ×2 IMPLANT
GAUZE PAD ABD 8X10 STRL (GAUZE/BANDAGES/DRESSINGS) ×6 IMPLANT
GAUZE SPONGE 4X4 12PLY STRL (GAUZE/BANDAGES/DRESSINGS) ×2 IMPLANT
GLOVE BIO SURGEON STRL SZ 6.5 (GLOVE) ×6 IMPLANT
GLOVE BIO SURGEON STRL SZ7.5 (GLOVE) ×8 IMPLANT
GLOVE BIOGEL PI IND STRL 6.5 (GLOVE) ×2 IMPLANT
GLOVE BIOGEL PI IND STRL 7.5 (GLOVE) ×2 IMPLANT
GOWN STRL REUS W/ TWL LRG LVL3 (GOWN DISPOSABLE) ×6 IMPLANT
K-WIRE FXSTD 280X2XNS SS (WIRE) ×3 IMPLANT
KIT BASIN OR (CUSTOM PROCEDURE TRAY) ×2 IMPLANT
KIT TURNOVER KIT B (KITS) ×2 IMPLANT
KWIRE FXSTD 280X2XNS SS (WIRE) IMPLANT
NS IRRIG 1000ML POUR BTL (IV SOLUTION) ×2 IMPLANT
PACK TOTAL JOINT (CUSTOM PROCEDURE TRAY) ×2 IMPLANT
PAD ARMBOARD 7.5X6 YLW CONV (MISCELLANEOUS) ×4 IMPLANT
PAD CAST 4YDX4 CTTN HI CHSV (CAST SUPPLIES) ×2 IMPLANT
PADDING CAST COTTON 6X4 STRL (CAST SUPPLIES) ×2 IMPLANT
PADDING CAST SYNTHETIC 4X4 STR (CAST SUPPLIES) IMPLANT
PADDING CAST SYNTHETIC 6X4 NS (CAST SUPPLIES) IMPLANT
PLATE DIST FEM 12H (Plate) IMPLANT
SCREW CORT NCB SELFTAP 5.0X42 (Screw) IMPLANT
SCREW CORTICAL NCB 5.0X40 (Screw) IMPLANT
SCREW CORTICAL NCB 5.0X44 (Screw) IMPLANT
SCREW CORTICAL NCB 5.0X90MM (Screw) IMPLANT
SCREW NCB 4.0MX38M (Screw) IMPLANT
SCREW NCB 5.0X85MM (Screw) IMPLANT
SPONGE T-LAP 18X18 ~~LOC~~+RFID (SPONGE) IMPLANT
STAPLER VISISTAT 35W (STAPLE) ×2 IMPLANT
SUCTION TUBE FRAZIER 10FR DISP (SUCTIONS) ×2 IMPLANT
SUT ETHILON 3 0 PS 1 (SUTURE) ×4 IMPLANT
SUT MNCRL AB 3-0 PS2 18 (SUTURE) IMPLANT
SUT MON AB 2-0 CT1 36 (SUTURE) IMPLANT
SUT VIC AB 0 CT1 27XBRD ANBCTR (SUTURE) IMPLANT
SUT VIC AB 1 CT1 27XBRD ANBCTR (SUTURE) IMPLANT
SUT VIC AB 2-0 CT1 TAPERPNT 27 (SUTURE) ×4 IMPLANT
TOWEL GREEN STERILE (TOWEL DISPOSABLE) ×4 IMPLANT
TRAY FOLEY MTR SLVR 16FR STAT (SET/KITS/TRAYS/PACK) IMPLANT
WATER STERILE IRR 1000ML POUR (IV SOLUTION) ×4 IMPLANT

## 2023-06-29 NOTE — Anesthesia Postprocedure Evaluation (Signed)
Anesthesia Post Note  Patient: [employer]  Procedure(s) Performed: OPEN REDUCTION INTERNAL FIXATION (ORIF) DISTAL FEMUR FRACTURE (Left)     Patient location during evaluation: PACU Anesthesia Type: General Level of consciousness: awake and alert and oriented Pain management: pain level controlled Vital Signs Assessment: post-procedure vital signs reviewed and stable Respiratory status: spontaneous breathing, nonlabored ventilation and respiratory function stable Cardiovascular status: blood pressure returned to baseline and stable Postop Assessment: no apparent nausea or vomiting Anesthetic complications: no   No notable events documented.  Last Vitals:  Vitals:   06/29/23 1315 06/29/23 1354  BP: (!) 154/69 (!) 160/73  Pulse: 70 73  Resp: 20 18  Temp: 36.4 C 37.3 C  SpO2: 94% 100%    Last Pain:  Vitals:   06/29/23 1354  TempSrc: Oral  PainSc:                  Kathleene Bergemann A.

## 2023-06-29 NOTE — Transfer of Care (Signed)
mediate Anesthesia Transfer of Care Note  Patient: Billy Martin  Procedure(s) Performed: OPEN REDUCTION INTERNAL FIXATION (ORIF) DISTAL FEMUR FRACTURE (Left)  Patient Location: PACU  Anesthesia Type:General  Level of Consciousness: oriented, drowsy, patient cooperative, and responds to stimulation  Airway & Oxygen Therapy: Patient Spontanous Breathing and Patient connected to nasal cannula oxygen  Post-op Assessment: Report given to RN, Post -op Vital signs reviewed and stable, and Patient moving all extremities X 4  Post vital signs: Reviewed and stable  Last Vitals:  Vitals Value Taken Time  BP 159/72 06/29/23 1245  Temp 97.4   Pulse 75 06/29/23 1246  Resp 20 06/29/23 1246  SpO2 93 % 06/29/23 1246  Vitals shown include unfiled device data.  Last Pain:  Vitals:   06/29/23 0935  TempSrc:   PainSc: 4          Complications: No notable events documented.
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
## 2023-06-29 NOTE — Progress Notes (Signed)
[facility] Pre-op Review 1.Surgical orders: Consent orders:  No order in EPIC Consent signed: Patient alert and oriented: Antibiotic: Ancef 2G 0627 Pre-meds:  2.  Pre-procedure checklist completed:  waiting chat response from floor RN   3.  NPO:  waiting chat response from floor RN  4.  CHG Bath completed:  waiting chat response from floor RN      Belongings removed and placed in clean gown:  5.  Labs Performed: CBC    Abnormal 06/29/23 CMP/BMP   Abnormal 06/29/23 PT/INR  WNL 06/28/23 HA1C   NA Type and Screen  Sample in Blood Bank for testing Surgical PCR:  Pregnancy:  NA  EKG:  Chest x-ray:   6.  Recent H&P or progress note if inpatient:  06/28/23  7.  Language Barrier:  None  8.  Vital Signs:  Abnormal Oxygen:  Not documented Tele:   Can the patient travel without Tele  9.  Medications: Cardiac Drips:  Pain Medications: Toradol 30mg  IV 0624 Robaxin 500mg  IV 0725 Oxycodone 5mg  po 0734 Beta Blocker:   NA Anticoagulants:  NA GLP1:   NA  10. IV access: 18 Left AC  11. Diabetic:     NA
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
## 2023-06-29 NOTE — Plan of Care (Signed)

## 2023-06-29 NOTE — Interval H&P Note (Signed)
History and Physical Interval Note:  06/29/2023 9:57 AM  Billy Martin  has presented today for surgery, with the diagnosis of Left distal femur fracture.  The various methods of treatment have been discussed with the patient and family. After consideration of risks, benefits and other options for treatment, the patient has consented to  Procedure(s): OPEN REDUCTION INTERNAL FIXATION (ORIF) DISTAL FEMUR FRACTURE (Left) as a surgical intervention.  The patient's history has been reviewed, patient examined, no change in status, stable for surgery.  I have reviewed the patient's chart and labs.  Questions were answered to the patient's satisfaction.     Caryn Bee P Mohmmad Saleeby
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
## 2023-06-30 ENCOUNTER — Encounter (HOSPITAL_COMMUNITY): Payer: Self-pay | Admitting: Student

## 2023-06-30 LAB — CBC
HCT: 31.6 % — ABNORMAL LOW (ref 39.0–52.0)
Hemoglobin: 10.4 g/dL — ABNORMAL LOW (ref 13.0–17.0)
MCH: 30.6 pg (ref 26.0–34.0)
MCHC: 32.9 g/dL (ref 30.0–36.0)
MCV: 92.9 fL (ref 80.0–100.0)
Platelets: 216 10*3/uL (ref 150–400)
RBC: 3.4 MIL/uL — ABNORMAL LOW (ref 4.22–5.81)
RDW: 12.3 % (ref 11.5–15.5)
WBC: 8.8 10*3/uL (ref 4.0–10.5)
nRBC: 0 % (ref 0.0–0.2)

## 2023-06-30 LAB — BASIC METABOLIC PANEL
Anion gap: 5 (ref 5–15)
BUN: 19 mg/dL (ref 8–23)
CO2: 23 mmol/L (ref 22–32)
Calcium: 8.2 mg/dL — ABNORMAL LOW (ref 8.9–10.3)
Chloride: 107 mmol/L (ref 98–111)
Creatinine, Ser: 1.21 mg/dL (ref 0.61–1.24)
GFR, Estimated: >60 mL/min (ref >60)
Glucose, Bld: 107 mg/dL — ABNORMAL HIGH (ref 70–99)
Potassium: 4.1 mmol/L (ref 3.5–5.1)
Sodium: 135 mmol/L (ref 135–145)

## 2023-06-30 LAB — GLUCOSE, CAPILLARY: Glucose-Capillary: 139 mg/dL — ABNORMAL HIGH (ref 70–99)

## 2023-06-30 MED ORDER — VITAMIN D (ERGOCALCIFEROL) 1.25 MG (50000 UNIT) PO CAPS
50000.0000 [IU] | ORAL_CAPSULE | ORAL | Status: DC
  Administered 2023-06-30: 50000 [IU] via ORAL
  Filled 2023-06-30: qty 1

## 2023-06-30 MED ORDER — ACETAMINOPHEN 500 MG PO TABS
1000.0000 mg | ORAL_TABLET | Freq: Four times a day (QID) | ORAL | Status: DC
  Administered 2023-06-30 – 2023-07-04 (×16): 1000 mg via ORAL
  Filled 2023-06-30 (×15): qty 2
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
## 2023-07-01 ENCOUNTER — Other Ambulatory Visit (HOSPITAL_COMMUNITY): Payer: Self-pay

## 2023-07-01 LAB — CBC
HCT: 30.5 % — ABNORMAL LOW (ref 39.0–52.0)
Hemoglobin: 9.8 g/dL — ABNORMAL LOW (ref 13.0–17.0)
MCH: 30.2 pg (ref 26.0–34.0)
MCHC: 32.1 g/dL (ref 30.0–36.0)
MCV: 94.1 fL (ref 80.0–100.0)
Platelets: 208 K/uL (ref 150–400)
RBC: 3.24 MIL/uL — ABNORMAL LOW (ref 4.22–5.81)
RDW: 12.5 % (ref 11.5–15.5)
WBC: 6.7 K/uL (ref 4.0–10.5)
nRBC: 0 % (ref 0.0–0.2)

## 2023-07-01 MED ORDER — OXYCODONE HCL 10 MG PO TABS
5.0000 mg | ORAL_TABLET | ORAL | 0 refills | Status: AC | PRN
  Filled 2023-07-01: qty 42, 7d supply, fill #0

## 2023-07-01 MED ORDER — METHOCARBAMOL 500 MG PO TABS
500.0000 mg | ORAL_TABLET | Freq: Four times a day (QID) | ORAL | 0 refills | Status: AC | PRN
  Filled 2023-07-01: qty 28, 7d supply, fill #0

## 2023-07-01 MED ORDER — VITAMIN D (ERGOCALCIFEROL) 1.25 MG (50000 UNIT) PO CAPS
50000.0000 [IU] | ORAL_CAPSULE | ORAL | 0 refills | Status: AC
  Filled 2023-07-01: qty 8, 56d supply, fill #0

## 2023-07-01 MED ORDER — ASPIRIN 325 MG PO TBEC
325.0000 mg | DELAYED_RELEASE_TABLET | Freq: Every day | ORAL | 0 refills | Status: AC
  Filled 2023-07-01: qty 30, 30d supply, fill #0

## 2023-07-01 NOTE — Plan of Care (Signed)

## 2023-07-01 NOTE — Discharge Instructions (Signed)
Orthopaedic Trauma Service Discharge Instructions   General Discharge Instructions  WEIGHT BEARING STATUS: Nonweightbearing left lower extremity  RANGE OF MOTION/ACTIVITY: Okay for unrestricted knee range of motion  Wound Care: You may remove your surgical dressing on postoperative day #3 (07/02/2023). Incisions can be left open to air if there is no drainage. Once the incision is completely dry and without drainage, it may be left open to air out.  Showering may begin postoperative day #4 (07/03/2023).  Clean incision gently with soap and water.  DVT/PE prophylaxis: Aspirin 325 mg daily x 30 days  Diet: as you were eating previously.  Can use over the counter stool softeners and bowel preparations, such as Miralax, to help with bowel movements.  Narcotics can be constipating.  Be sure to drink plenty of fluids  PAIN MEDICATION USE AND EXPECTATIONS  You have likely been given narcotic medications to help control your pain.  After a traumatic event that results in an fracture (broken bone) with or without surgery, it is ok to use narcotic pain medications to help control one's pain.  We understand that everyone responds to pain differently and each individual patient will be evaluated on a regular basis for the continued need for narcotic medications. Ideally, narcotic medication use should last no more than 6-8 weeks (coinciding with fracture healing).   As a patient it is your responsibility as well to monitor narcotic medication use and report the amount and frequency you use these medications when you come to your office visit.   We would also advise that if you are using narcotic medications, you should take a dose prior to therapy to maximize you participation.  IF YOU ARE ON NARCOTIC MEDICATIONS IT IS NOT PERMISSIBLE TO OPERATE A MOTOR VEHICLE (MOTORCYCLE/CAR/TRUCK/MOPED) OR HEAVY MACHINERY DO NOT MIX NARCOTICS WITH OTHER CNS (CENTRAL NERVOUS SYSTEM) DEPRESSANTS SUCH AS  ALCOHOL   STOP SMOKING OR USING NICOTINE PRODUCTS!!!!  As discussed nicotine severely impairs your body's ability to heal surgical and traumatic wounds but also impairs bone healing.  Wounds and bone heal by forming microscopic blood vessels (angiogenesis) and nicotine is a vasoconstrictor (essentially, shrinks blood vessels).  Therefore, if vasoconstriction occurs to these microscopic blood vessels they essentially disappear and are unable to deliver necessary nutrients to the healing tissue.  This is one modifiable factor that you can do to dramatically increase your chances of healing your injury.    (This means no smoking, no nicotine gum, patches, etc)  DO NOT USE NONSTEROIDAL ANTI-INFLAMMATORY DRUGS (NSAID'S)  Using products such as Advil (ibuprofen), Aleve (naproxen), Motrin (ibuprofen) for additional pain control during fracture healing can delay and/or prevent the healing response.  If you would like to take over the counter (OTC) medication, Tylenol (acetaminophen) is ok.  However, some narcotic medications that are given for pain control contain acetaminophen as well. Therefore, you should not exceed more than 4000 mg of tylenol in a day if you do not have liver disease.  Also note that there are may OTC medicines, such as cold medicines and allergy medicines that my contain tylenol as well.  If you have any questions about medications and/or interactions please ask your doctor/PA or your pharmacist.      ICE AND ELEVATE INJURED/OPERATIVE EXTREMITY  Using ice and elevating the injured extremity above your heart can help with swelling and pain control.  Icing in a pulsatile fashion, such as 20 minutes on and 20 minutes off, can be followed.    Do not place  ice directly on skin. Make sure there is a barrier between to skin and the ice pack.    Using frozen items such as frozen peas works well as the conform nicely to the are that needs to be iced.  USE AN ACE WRAP OR TED HOSE FOR SWELLING  CONTROL  In addition to icing and elevation, Ace wraps or TED hose are used to help limit and resolve swelling.  It is recommended to use Ace wraps or TED hose until you are informed to stop.    When using Ace Wraps start the wrapping distally (farthest away from the body) and wrap proximally (closer to the body)   Example: If you had surgery on your leg or thing and you do not have a splint on, start the ace wrap at the toes and work your way up to the thigh        If you had surgery on your upper extremity and do not have a splint on, start the ace wrap at your fingers and work your way up to the upper arm   CALL THE OFFICE FOR MEDICATION REFILLS OR WITH ANY QUESTIONS/CONCERNS: [phone]   VISIT OUR WEBSITE FOR ADDITIONAL INFORMATION: [email or web address]    Discharge Wound Care Instructions  Do NOT apply any ointments, solutions or lotions to pin sites or surgical wounds.  These prevent needed drainage and even though solutions like hydrogen peroxide kill bacteria, they also damage cells lining the pin sites that help fight infection.  Applying lotions or ointments can keep the wounds moist and can cause them to breakdown and open up as well. This can increase the risk for infection. When in doubt call the office.   If any drainage is noted, use one layer of adaptic or Mepitel, then gauze, Kerlix, and an ace wrap. - These dressing supplies should be available at local medical supply stores [facility], [facility], etc) as well as Insurance claims handler ([facility], [facility], [place], etc)  Once the incision is completely dry and without drainage, it may be left open to air out.  Showering may begin 36-48 hours later.  Cleaning gently with soap and water.
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
## 2023-07-02 NOTE — TOC Progression Note (Signed)
Discharge medications (4) are being stored in the Transitions of Care (TOC) Pharmacy on the second floor until patient is ready for discharge.    
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
## 2023-07-02 NOTE — Plan of Care (Signed)
  Problem: Education: Goal: Knowledge of General Education information will improve Description: Including pain rating scale, medication(s)/side effects and non-pharmacologic comfort measures Outcome: Progressing   Problem: Activity: Goal: Risk for activity intolerance will decrease Outcome: Progressing   Problem: Pain Management: Goal: General experience of comfort will improve Outcome: Progressing   Problem: Safety: Goal: Ability to remain free from injury will improve Outcome: Progressing   Problem: Skin Integrity: Goal: Risk for impaired skin integrity will decrease Outcome: Progressing

## 2023-07-03 NOTE — Progress Notes (Signed)
    Subjective: Patient reports pain as moderate. Feels better today. Tolerating diet.  Urinating.   No CP, SOB.  Has continued to mobilize well OOB with PT. Says he doesn't have a ride home until Monday evening.  Objective:   VITALS:   Vitals:   07/02/23 1816 07/02/23 2034 07/03/23 0422 07/03/23 0840  BP: (!) 152/63 130/63 (!) 130/57 130/66  Pulse: 76 72 64 76  Resp: 20 18 16 18   Temp: 98.4 F (36.9 C) 98.6 F (37 C) 98.4 F (36.9 C) 98.5 F (36.9 C)  TempSrc: Oral Oral  Oral  SpO2:  99% 100% 98%  Weight:      Height:          Latest Ref Rng & Units 07/01/2023    6:24 AM 06/30/2023    5:03 AM 06/29/2023    2:23 PM  CBC  WBC 4.0 - 10.5 K/uL 6.7  8.8  8.9   Hemoglobin 13.0 - 17.0 g/dL 9.8  29.5  62.1   Hematocrit 39.0 - 52.0 % 30.5  31.6  36.7   Platelets 150 - 400 K/uL 208  216  225       Latest Ref Rng & Units 06/30/2023    5:03 AM 06/29/2023    2:23 PM 06/28/2023    6:43 PM  BMP  Glucose 70 - 99 mg/dL 308   657   BUN 8 - 23 mg/dL 19   22   Creatinine 8.46 - 1.24 mg/dL 9.62  9.52  8.41   Sodium 135 - 145 mmol/L 135   142   Potassium 3.5 - 5.1 mmol/L 4.1   3.7   Chloride 98 - 111 mmol/L 107   107   CO2 22 - 32 mmol/L 23     Calcium 8.9 - 10.3 mg/dL 8.2      Intake/Output      12/14 0701 12/15 0700 12/15 0701 12/16 0700   P.O.     Total Intake(mL/kg)     Urine (mL/kg/hr)     Total Output     Net             Physical Exam: General: NAD.  Sitting up in bed, calm, comfortable Resp: No increased wob Cardio: regular rate and rhythm ABD soft Neurologically intact MSK Neurovascularly intact Sensation intact distally Intact pulses distally Dorsiflexion/Plantar flexion intact Incision: dressing C/D/I   Assessment: 4 Days Post-Op  S/P Procedure(s) (LRB): OPEN REDUCTION INTERNAL FIXATION (ORIF) DISTAL FEMUR FRACTURE (Left) by Dr. Jena Gauss on 06/29/23  Principal Problem:   Open fracture of left distal femur (HCC)    Plan:  Advance diet Up with  therapy Incentive Spirometry Elevate and Apply ice  Weightbearing: NWB LLE Insicional and dressing care: Dressings left intact until follow-up Orthopedic device(s): None Showering: Keep dressing dry VTE prophylaxis: Lovenox 40mg  qd , SCDs, ambulation Pain control: PRN Follow - up plan: 2 weeks in office with [place]   Dispo: Home tomorrow.   Meds already sent to [facility] pharmacy: -Oxycodone, Robaxin, Tylenol for pain control -Aspirin 325 mg daily x 30 days for DVT prophylaxis -Continue 50,000 units Vit D supplementation weekly x 8 weeks   Jenne Pane, [place] Office [phone] 07/03/2023, 1:36 PM

## 2023-07-03 NOTE — Plan of Care (Signed)
  Problem: Education: Goal: Knowledge of General Education information will improve Description: Including pain rating scale, medication(s)/side effects and non-pharmacologic comfort measures Outcome: Progressing   Problem: Activity: Goal: Risk for activity intolerance will decrease Outcome: Progressing   Problem: Pain Management: Goal: General experience of comfort will improve Outcome: Progressing

## 2023-07-04 ENCOUNTER — Other Ambulatory Visit (HOSPITAL_COMMUNITY): Payer: Self-pay

## 2023-07-04 NOTE — TOC Transition Note (Addendum)
Transition of Care [facility]) - Discharge Note   Patient Details  Name: Billy Martin MRN: [id number] Date of Birth: June 04, 1961  Transition of Care [facility]) CM/SW Contact:  Epifanio Lesches, RN Phone Number: 07/04/2023, 10:54 AM   Clinical Narrative:     Patient will DC to: home Anticipated DC date: 07/04/2023 Family notified: yes Transport by: car  Left distal femur fracture, s/p ORIF to L femur fx Per MD patient ready for DC today. RN, patient, and  patient's family  notified of DC. Pt agreeable to PT's recommendation for home health services. Pt without insurance. Referral made with [facility] for charity care HHPT and accepted. Pt declined w/c, BSC. Requested RW. Referral made with Vaughan Basta with [employer] for charity RW and accepted. Equipment will be delivered to bedside prior to d/c. [facility] pharmacy filling RX meds. Pt to receive medications prior to d/c. Post hospital f/u noted on AVS.  Friend to provide transportation to home.    RNCM will sign off for now as intervention is no longer needed. Please consult [place] again if new needs arise.   Patient Goals and CMS Choice     Choice offered to / list presented to : Patient (charity care)      Discharge Placement                       Discharge Plan and Services Additional resources added to the After Visit Summary for     Discharge Planning Services: CM Consult            DME Arranged: Dan Humphreys rolling DME Agency: [employer] Date DME Agency Contacted: 07/04/23 Time DME Agency Contacted: 1049 Representative spoke with at DME Agency: Vaughan Basta HH Arranged: PT HH Agency: [facility] Date HH Agency Contacted: 07/04/23 Time HH Agency Contacted: 1049 Representative spoke with at [facility] Agency: Vesta Mixer  Social Drivers of Health (SDOH) Interventions SDOH Screenings   Food Insecurity: No Food Insecurity (06/29/2023)  Housing: Patient Declined (06/29/2023)  Transportation Needs: No  Transportation Needs (06/29/2023)  Utilities: Not At Risk (06/29/2023)  Tobacco Use: Low Risk  (06/29/2023)     Readmission Risk Interventions     No data to display

## 2023-07-04 NOTE — Care Plan (Signed)
Patient discharged home w/ home health. All discharge instructions including medication regimen and follow-up appointments explained to patient. No IV in site. Medication from [facility] pharmacy given to patient at bedside. RW also given at bedside. Patient left unit via w/c. All personal belongings taken with patient.
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
## 2023-07-04 NOTE — Plan of Care (Signed)
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
## 2024-01-25 ENCOUNTER — Ambulatory Visit (HOSPITAL_COMMUNITY)
Admission: EM | Admit: 2024-01-25 | Discharge: 2024-01-25 | Disposition: A | Discharge status: Home / Self Care | Attending: Emergency Medicine | Admitting: Emergency Medicine

## 2024-01-25 ENCOUNTER — Encounter (HOSPITAL_COMMUNITY): Payer: Self-pay

## 2024-01-25 DIAGNOSIS — Z113 Encounter for screening for infections with a predominantly sexual mode of transmission: Secondary | ICD-10-CM | POA: Insufficient documentation

## 2024-01-25 DIAGNOSIS — R369 Urethral discharge, unspecified: Secondary | ICD-10-CM | POA: Diagnosis present

## 2024-01-25 LAB — HIV ANTIBODY (ROUTINE TESTING W REFLEX): HIV Screen 4th Generation wRfx: NONREACTIVE

## 2024-01-25 MED ORDER — CEFTRIAXONE SODIUM 500 MG IJ SOLR
INTRAMUSCULAR | Status: AC
  Filled 2024-01-25: qty 500

## 2024-01-25 MED ORDER — CEFTRIAXONE SODIUM 500 MG IJ SOLR
500.0000 mg | INTRAMUSCULAR | Status: DC
  Administered 2024-01-25: 500 mg via INTRAMUSCULAR

## 2024-01-25 MED ORDER — DOXYCYCLINE HYCLATE 100 MG PO CAPS: 100.0000 mg | ORAL_CAPSULE | Freq: Two times a day (BID) | ORAL | 0 refills | Status: AC
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
## 2024-01-25 NOTE — ED Triage Notes (Signed)
 Patient presents to the office for penile discharge x 3 days.

## 2024-01-25 NOTE — Discharge Instructions (Addendum)
 You were given an injection of Rocephin  in clinic today to treat for possible gonorrhea. Starting doxycycline  twice daily for 7 days for possible chlamydia. Your results will return in the next few days and someone will call if results are positive and if any adjustment in treatment is required.  Return here as needed.

## 2024-01-26 ENCOUNTER — Ambulatory Visit (HOSPITAL_COMMUNITY): Payer: Self-pay

## 2024-01-26 LAB — CYTOLOGY, (ORAL, ANAL, URETHRAL) ANCILLARY ONLY
Chlamydia: NEGATIVE
Comment: NEGATIVE
Comment: NEGATIVE
Comment: NORMAL
Neisseria Gonorrhea: POSITIVE — AB
Trichomonas: NEGATIVE

## 2024-01-26 LAB — RPR: RPR Ser Ql: NONREACTIVE

## 2024-03-12 ENCOUNTER — Ambulatory Visit (HOSPITAL_COMMUNITY): Payer: Self-pay
